# Patient Record
Sex: Male | Born: 1965 | Race: White | Hispanic: No | Marital: Married | Smoking: Never smoker
Health system: Southern US, Community
[De-identification: ages and names within clinical notes are randomized; demographics above are authoritative.]

## PROBLEM LIST (undated history)

## (undated) DIAGNOSIS — E785 Hyperlipidemia, unspecified: Secondary | ICD-10-CM

## (undated) HISTORY — PX: CARPAL TUNNEL RELEASE: SHX101

## (undated) HISTORY — PX: VASECTOMY: SHX75

---

## 2014-10-09 ENCOUNTER — Encounter (HOSPITAL_COMMUNITY)
Admission: EM | Disposition: A | Discharge status: Home / Self Care | Payer: Self-pay | Source: Home / Self Care | Attending: Emergency Medicine

## 2014-10-09 ENCOUNTER — Observation Stay (HOSPITAL_COMMUNITY)
Admission: EM | Admit: 2014-10-09 | Discharge: 2014-10-10 | Disposition: A | Discharge status: Home / Self Care | Payer: No Typology Code available for payment source | Attending: General Surgery | Admitting: General Surgery

## 2014-10-09 ENCOUNTER — Encounter (HOSPITAL_COMMUNITY): Payer: Self-pay | Admitting: Emergency Medicine

## 2014-10-09 ENCOUNTER — Observation Stay (HOSPITAL_COMMUNITY): Payer: No Typology Code available for payment source | Admitting: Registered Nurse

## 2014-10-09 ENCOUNTER — Emergency Department (HOSPITAL_COMMUNITY): Payer: No Typology Code available for payment source

## 2014-10-09 DIAGNOSIS — K3589 Other acute appendicitis without perforation or gangrene: Secondary | ICD-10-CM

## 2014-10-09 DIAGNOSIS — K3580 Unspecified acute appendicitis: Secondary | ICD-10-CM | POA: Diagnosis present

## 2014-10-09 DIAGNOSIS — E785 Hyperlipidemia, unspecified: Secondary | ICD-10-CM | POA: Diagnosis not present

## 2014-10-09 DIAGNOSIS — R109 Unspecified abdominal pain: Secondary | ICD-10-CM

## 2014-10-09 HISTORY — DX: Hyperlipidemia, unspecified: E78.5

## 2014-10-09 HISTORY — PX: LAPAROSCOPIC APPENDECTOMY: SHX408

## 2014-10-09 LAB — URINALYSIS, ROUTINE W REFLEX MICROSCOPIC
Bilirubin Urine: NEGATIVE
GLUCOSE, UA: NEGATIVE mg/dL
Hgb urine dipstick: NEGATIVE
KETONES UR: NEGATIVE mg/dL
Leukocytes, UA: NEGATIVE
Nitrite: NEGATIVE
PH: 7 (ref 5.0–8.0)
Protein, ur: NEGATIVE mg/dL
SPECIFIC GRAVITY, URINE: 1.014 (ref 1.005–1.030)
Urobilinogen, UA: 0.2 mg/dL (ref 0.0–1.0)

## 2014-10-09 LAB — CBC WITH DIFFERENTIAL/PLATELET
Basophils Absolute: 0 10*3/uL (ref 0.0–0.1)
Basophils Relative: 0 % (ref 0–1)
EOS ABS: 0.3 10*3/uL (ref 0.0–0.7)
Eosinophils Relative: 2 % (ref 0–5)
HCT: 46.2 % (ref 39.0–52.0)
Hemoglobin: 16.6 g/dL (ref 13.0–17.0)
LYMPHS ABS: 2.3 10*3/uL (ref 0.7–4.0)
Lymphocytes Relative: 20 % (ref 12–46)
MCH: 30 pg (ref 26.0–34.0)
MCHC: 35.9 g/dL (ref 30.0–36.0)
MCV: 83.5 fL (ref 78.0–100.0)
MONOS PCT: 10 % (ref 3–12)
Monocytes Absolute: 1.1 10*3/uL — ABNORMAL HIGH (ref 0.1–1.0)
NEUTROS ABS: 7.9 10*3/uL — AB (ref 1.7–7.7)
NEUTROS PCT: 68 % (ref 43–77)
Platelets: 182 10*3/uL (ref 150–400)
RBC: 5.53 MIL/uL (ref 4.22–5.81)
RDW: 12.1 % (ref 11.5–15.5)
WBC: 11.6 10*3/uL — ABNORMAL HIGH (ref 4.0–10.5)

## 2014-10-09 LAB — BASIC METABOLIC PANEL
ANION GAP: 10 (ref 5–15)
BUN: 19 mg/dL (ref 6–20)
CHLORIDE: 103 mmol/L (ref 101–111)
CO2: 24 mmol/L (ref 22–32)
CREATININE: 1.1 mg/dL (ref 0.61–1.24)
Calcium: 9.2 mg/dL (ref 8.9–10.3)
GFR calc Af Amer: >60 mL/min (ref >60)
GFR calc non Af Amer: >60 mL/min (ref >60)
Glucose, Bld: 105 mg/dL — ABNORMAL HIGH (ref 65–99)
POTASSIUM: 4.2 mmol/L (ref 3.5–5.1)
Sodium: 137 mmol/L (ref 135–145)

## 2014-10-09 SURGERY — APPENDECTOMY, LAPAROSCOPIC
Anesthesia: General | Site: Abdomen

## 2014-10-09 MED ORDER — BUPIVACAINE-EPINEPHRINE (PF) 0.25% -1:200000 IJ SOLN
INTRAMUSCULAR | Status: DC | PRN
  Administered 2014-10-09: 20 mL

## 2014-10-09 MED ORDER — FENTANYL CITRATE (PF) 100 MCG/2ML IJ SOLN
INTRAMUSCULAR | Status: DC | PRN
  Administered 2014-10-09 (×5): 50 ug via INTRAVENOUS

## 2014-10-09 MED ORDER — OXYCODONE-ACETAMINOPHEN 5-325 MG PO TABS: 1.0000 | ORAL_TABLET | ORAL | Status: DC | PRN

## 2014-10-09 MED ORDER — MIDAZOLAM HCL 2 MG/2ML IJ SOLN
INTRAMUSCULAR | Status: AC
  Filled 2014-10-09: qty 2

## 2014-10-09 MED ORDER — ACETAMINOPHEN 325 MG PO TABS
650.0000 mg | ORAL_TABLET | ORAL | Status: DC | PRN
  Filled 2014-10-09: qty 2

## 2014-10-09 MED ORDER — KCL IN DEXTROSE-NACL 20-5-0.45 MEQ/L-%-% IV SOLN
INTRAVENOUS | Status: DC
  Administered 2014-10-09: 15:00:00 via INTRAVENOUS
  Filled 2014-10-09 (×2): qty 1000

## 2014-10-09 MED ORDER — ROCURONIUM BROMIDE 100 MG/10ML IV SOLN
INTRAVENOUS | Status: DC | PRN
  Administered 2014-10-09 (×2): 10 mg via INTRAVENOUS
  Administered 2014-10-09: 30 mg via INTRAVENOUS

## 2014-10-09 MED ORDER — SODIUM CHLORIDE 0.9 % IV SOLN
INTRAVENOUS | Status: DC
  Administered 2014-10-09: 08:00:00 via INTRAVENOUS

## 2014-10-09 MED ORDER — DEXAMETHASONE SODIUM PHOSPHATE 10 MG/ML IJ SOLN
INTRAMUSCULAR | Status: DC | PRN
  Administered 2014-10-09: 10 mg via INTRAVENOUS

## 2014-10-09 MED ORDER — ONDANSETRON HCL 4 MG/2ML IJ SOLN
INTRAMUSCULAR | Status: DC | PRN
  Administered 2014-10-09: 4 mg via INTRAVENOUS

## 2014-10-09 MED ORDER — KETOROLAC TROMETHAMINE 15 MG/ML IJ SOLN: 15.0000 mg | Freq: Four times a day (QID) | INTRAMUSCULAR | Status: DC | PRN

## 2014-10-09 MED ORDER — ONDANSETRON HCL 4 MG/2ML IJ SOLN
INTRAMUSCULAR | Status: AC
  Filled 2014-10-09: qty 2

## 2014-10-09 MED ORDER — ROCURONIUM BROMIDE 100 MG/10ML IV SOLN
INTRAVENOUS | Status: AC
  Filled 2014-10-09: qty 1

## 2014-10-09 MED ORDER — OXYCODONE HCL 5 MG PO TABS: 5.0000 mg | ORAL_TABLET | Freq: Once | ORAL | Status: DC | PRN

## 2014-10-09 MED ORDER — PROMETHAZINE HCL 25 MG/ML IJ SOLN: 6.2500 mg | INTRAMUSCULAR | Status: DC | PRN

## 2014-10-09 MED ORDER — DEXAMETHASONE SODIUM PHOSPHATE 10 MG/ML IJ SOLN
INTRAMUSCULAR | Status: AC
  Filled 2014-10-09: qty 1

## 2014-10-09 MED ORDER — DEXTROSE 5 % IV SOLN
2.0000 g | INTRAVENOUS | Status: AC
  Administered 2014-10-09: 2 g via INTRAVENOUS
  Filled 2014-10-09: qty 2

## 2014-10-09 MED ORDER — NEOSTIGMINE METHYLSULFATE 10 MG/10ML IV SOLN
INTRAVENOUS | Status: AC
  Filled 2014-10-09: qty 1

## 2014-10-09 MED ORDER — FENTANYL CITRATE (PF) 250 MCG/5ML IJ SOLN
INTRAMUSCULAR | Status: AC
  Filled 2014-10-09: qty 5

## 2014-10-09 MED ORDER — 0.9 % SODIUM CHLORIDE (POUR BTL) OPTIME
TOPICAL | Status: DC | PRN
  Administered 2014-10-09: 1000 mL

## 2014-10-09 MED ORDER — PROPOFOL 10 MG/ML IV BOLUS
INTRAVENOUS | Status: AC
  Filled 2014-10-09: qty 20

## 2014-10-09 MED ORDER — PROPOFOL 10 MG/ML IV BOLUS
INTRAVENOUS | Status: DC | PRN
  Administered 2014-10-09: 180 mg via INTRAVENOUS

## 2014-10-09 MED ORDER — GLYCOPYRROLATE 0.2 MG/ML IJ SOLN
INTRAMUSCULAR | Status: DC | PRN
  Administered 2014-10-09: .6 mg via INTRAVENOUS

## 2014-10-09 MED ORDER — LACTATED RINGERS IV SOLN
INTRAVENOUS | Status: DC | PRN
  Administered 2014-10-09: 11:00:00 via INTRAVENOUS

## 2014-10-09 MED ORDER — OXYCODONE HCL 5 MG/5ML PO SOLN
5.0000 mg | Freq: Once | ORAL | Status: DC | PRN
  Filled 2014-10-09: qty 5

## 2014-10-09 MED ORDER — HYDROMORPHONE HCL 1 MG/ML IJ SOLN: 0.2500 mg | INTRAMUSCULAR | Status: DC | PRN

## 2014-10-09 MED ORDER — SODIUM CHLORIDE 0.9 % IV SOLN: INTRAVENOUS | Status: DC

## 2014-10-09 MED ORDER — IOHEXOL 300 MG/ML  SOLN
50.0000 mL | Freq: Once | INTRAMUSCULAR | Status: AC | PRN
  Administered 2014-10-09: 50 mL via ORAL

## 2014-10-09 MED ORDER — LACTATED RINGERS IR SOLN
Status: DC | PRN
  Administered 2014-10-09: 1000 mL

## 2014-10-09 MED ORDER — MORPHINE SULFATE 2 MG/ML IJ SOLN: 1.0000 mg | INTRAMUSCULAR | Status: DC | PRN

## 2014-10-09 MED ORDER — SUCCINYLCHOLINE CHLORIDE 20 MG/ML IJ SOLN
INTRAMUSCULAR | Status: DC | PRN
  Administered 2014-10-09: 100 mg via INTRAVENOUS

## 2014-10-09 MED ORDER — LIDOCAINE HCL (CARDIAC) 20 MG/ML IV SOLN
INTRAVENOUS | Status: DC | PRN
  Administered 2014-10-09: 100 mg via INTRAVENOUS

## 2014-10-09 MED ORDER — LIDOCAINE HCL (CARDIAC) 20 MG/ML IV SOLN
INTRAVENOUS | Status: AC
  Filled 2014-10-09: qty 5

## 2014-10-09 MED ORDER — ONDANSETRON HCL 4 MG/2ML IJ SOLN: 4.0000 mg | INTRAMUSCULAR | Status: DC | PRN

## 2014-10-09 MED ORDER — ENOXAPARIN SODIUM 40 MG/0.4ML ~~LOC~~ SOLN
40.0000 mg | SUBCUTANEOUS | Status: DC
  Filled 2014-10-09: qty 0.4

## 2014-10-09 MED ORDER — KETOROLAC TROMETHAMINE 30 MG/ML IJ SOLN: 30.0000 mg | Freq: Once | INTRAMUSCULAR | Status: DC | PRN

## 2014-10-09 MED ORDER — DEXTROSE 5 % IV SOLN
1.0000 g | Freq: Four times a day (QID) | INTRAVENOUS | Status: DC
  Administered 2014-10-09 – 2014-10-10 (×3): 1 g via INTRAVENOUS
  Filled 2014-10-09 (×4): qty 1

## 2014-10-09 MED ORDER — IOHEXOL 300 MG/ML  SOLN
100.0000 mL | Freq: Once | INTRAMUSCULAR | Status: AC | PRN
  Administered 2014-10-09: 100 mL via INTRAVENOUS

## 2014-10-09 MED ORDER — GLYCOPYRROLATE 0.2 MG/ML IJ SOLN
INTRAMUSCULAR | Status: AC
  Filled 2014-10-09: qty 3

## 2014-10-09 MED ORDER — NEOSTIGMINE METHYLSULFATE 10 MG/10ML IV SOLN
INTRAVENOUS | Status: DC | PRN
  Administered 2014-10-09: 4 mg via INTRAVENOUS

## 2014-10-09 MED ORDER — BUPIVACAINE-EPINEPHRINE (PF) 0.25% -1:200000 IJ SOLN
INTRAMUSCULAR | Status: AC
  Filled 2014-10-09: qty 30

## 2014-10-09 SURGICAL SUPPLY — 28 items
APPLIER CLIP 5 13 M/L LIGAMAX5 (MISCELLANEOUS)
APPLIER CLIP ROT 10 11.4 M/L (STAPLE)
CHLORAPREP W/TINT 26ML (MISCELLANEOUS) ×2 IMPLANT
CLIP APPLIE 5 13 M/L LIGAMAX5 (MISCELLANEOUS) IMPLANT
CLIP APPLIE ROT 10 11.4 M/L (STAPLE) IMPLANT
CUTTER FLEX LINEAR 45M (STAPLE) ×2 IMPLANT
DECANTER SPIKE VIAL GLASS SM (MISCELLANEOUS) IMPLANT
DRAPE LAPAROSCOPIC ABDOMINAL (DRAPES) ×2 IMPLANT
ELECT REM PT RETURN 9FT ADLT (ELECTROSURGICAL) ×2
ELECTRODE REM PT RTRN 9FT ADLT (ELECTROSURGICAL) ×1 IMPLANT
GLOVE ECLIPSE 7.5 STRL STRAW (GLOVE) ×2 IMPLANT
GOWN STRL REUS W/TWL XL LVL3 (GOWN DISPOSABLE) ×4 IMPLANT
KIT BASIN OR (CUSTOM PROCEDURE TRAY) ×2 IMPLANT
LIQUID BAND (GAUZE/BANDAGES/DRESSINGS) IMPLANT
PENCIL BUTTON HOLSTER BLD 10FT (ELECTRODE) ×2 IMPLANT
POUCH SPECIMEN RETRIEVAL 10MM (ENDOMECHANICALS) ×4 IMPLANT
RELOAD 45 VASCULAR/THIN (ENDOMECHANICALS) IMPLANT
RELOAD STAPLE TA45 3.5 REG BLU (ENDOMECHANICALS) ×2 IMPLANT
SET IRRIG TUBING LAPAROSCOPIC (IRRIGATION / IRRIGATOR) ×2 IMPLANT
SHEARS HARMONIC ACE PLUS 36CM (ENDOMECHANICALS) ×2 IMPLANT
SLEEVE XCEL OPT CAN 5 100 (ENDOMECHANICALS) ×2 IMPLANT
SUT MNCRL AB 4-0 PS2 18 (SUTURE) ×2 IMPLANT
TOWEL OR 17X26 10 PK STRL BLUE (TOWEL DISPOSABLE) ×2 IMPLANT
TRAY FOLEY CATH 16FRSI W/METER (SET/KITS/TRAYS/PACK) ×2 IMPLANT
TRAY LAPAROSCOPIC (CUSTOM PROCEDURE TRAY) ×2 IMPLANT
TROCAR BLADELESS OPT 5 100 (ENDOMECHANICALS) ×2 IMPLANT
TROCAR XCEL 12X100 BLDLESS (ENDOMECHANICALS) IMPLANT
TROCAR XCEL BLUNT TIP 100MML (ENDOMECHANICALS) ×2 IMPLANT

## 2014-10-09 NOTE — ED Notes (Signed)
Pt back from CT at this time. Pt in NAD upon arrival back to unit.

## 2014-10-09 NOTE — ED Notes (Signed)
Pt denies pain with laying but reports 6 or 7 pain with movement.

## 2014-10-09 NOTE — Anesthesia Postprocedure Evaluation (Signed)
  Anesthesia Post-op Note  Patient: Sean GrossFrank Moreno  Procedure(s) Performed: Procedure(s): APPENDECTOMY LAPAROSCOPIC (N/A)  Patient Location: PACU  Anesthesia Type:General  Level of Consciousness: awake and alert   Airway and Oxygen Therapy: Patient Spontanous Breathing  Post-op Pain: mild  Post-op Assessment: Post-op Vital signs reviewed  Post-op Vital Signs: Reviewed  Last Vitals:  Filed Vitals:   10/09/14 1359  BP: 122/68  Pulse: 61  Temp: 36.9 C  Resp:     Complications: No apparent anesthesia complications

## 2014-10-09 NOTE — Anesthesia Preprocedure Evaluation (Addendum)
Anesthesia Evaluation  Patient identified by MRN, date of birth, ID band Patient awake    Reviewed: Allergy & Precautions, NPO status , Patient's Chart, lab work & pertinent test results  History of Anesthesia Complications (+) AWARENESS UNDER ANESTHESIA  Airway Mallampati: I  TM Distance: >3 FB Neck ROM: Full    Dental  (+) Teeth Intact, Dental Advisory Given   Pulmonary neg pulmonary ROS,  breath sounds clear to auscultation        Cardiovascular negative cardio ROS  Rhythm:Regular Rate:Normal     Neuro/Psych negative neurological ROS     GI/Hepatic Neg liver ROS, Acute appendicitis   Endo/Other  negative endocrine ROS  Renal/GU negative Renal ROS     Musculoskeletal negative musculoskeletal ROS (+)   Abdominal   Peds  Hematology negative hematology ROS (+)   Anesthesia Other Findings   Reproductive/Obstetrics                           Lab Results  Component Value Date   WBC 11.6* 10/09/2014   HGB 16.6 10/09/2014   HCT 46.2 10/09/2014   MCV 83.5 10/09/2014   PLT 182 10/09/2014   Lab Results  Component Value Date   CREATININE 1.10 10/09/2014   BUN 19 10/09/2014   NA 137 10/09/2014   K 4.2 10/09/2014   CL 103 10/09/2014   CO2 24 10/09/2014    Anesthesia Physical Anesthesia Plan  ASA: I and emergent  Anesthesia Plan: General   Post-op Pain Management:    Induction: Intravenous, Rapid sequence and Cricoid pressure planned  Airway Management Planned: Oral ETT  Additional Equipment:   Intra-op Plan:   Post-operative Plan: Extubation in OR  Informed Consent: I have reviewed the patients History and Physical, chart, labs and discussed the procedure including the risks, benefits and alternatives for the proposed anesthesia with the patient or authorized representative who has indicated his/her understanding and acceptance.   Dental advisory given  Plan Discussed  with: CRNA  Anesthesia Plan Comments:       Anesthesia Quick Evaluation

## 2014-10-09 NOTE — Transfer of Care (Signed)
Immediate Anesthesia Transfer of Care Note  Patient: Sean GrossFrank Moreno  Procedure(s) Performed: Procedure(s): APPENDECTOMY LAPAROSCOPIC (N/A)  Patient Location: PACU  Anesthesia Type:General  Level of Consciousness: awake, alert , oriented and patient cooperative  Airway & Oxygen Therapy: Patient Spontanous Breathing and Patient connected to face mask oxygen  Post-op Assessment: Report given to RN, Post -op Vital signs reviewed and stable and Patient moving all extremities  Post vital signs: Reviewed and stable  Last Vitals:  Filed Vitals:   10/09/14 0921  BP: 123/64  Pulse: 61  Temp: 36.6 C  Resp: 18    Complications: No apparent anesthesia complications

## 2014-10-09 NOTE — ED Notes (Signed)
Pt presents to ED C/O RLQ pain that started 4 days ago. Pt stated that pain got worse last night, pt also states he is nauseous, but denies throwing up at this time. Pt reports pain as being sharp and cramping like.

## 2014-10-09 NOTE — ED Notes (Signed)
Pt refuses pain/nausea medication administration at present time. Pt states will alert staff if change.

## 2014-10-09 NOTE — ED Provider Notes (Signed)
CSN: [id number]     Arrival date & time 10/09/14  [id number] History   First MD Initiated Contact with Patient 10/09/14 [phone]     Chief Complaint  Patient presents with  . Abdominal Pain     (Consider location/radiation/quality/duration/timing/severity/associated sxs/prior Treatment) Patient is a 49 y.o. male presenting with abdominal pain. The history is provided by the patient.  Abdominal Pain Pain location:  RLQ Pain quality: stabbing   Pain radiates to:  RUQ Pain severity:  Moderate Onset quality:  Gradual Duration:  5 days Timing:  Constant Progression:  Worsening Chronicity:  New Relieved by:  Nothing Worsened by:  Movement Ineffective treatments:  None tried Associated symptoms: nausea   Associated symptoms: no chills, no fever, no hematuria and no vomiting     Past Medical History  Diagnosis Date  . Hyperlipidemia    Past Surgical History  Procedure Laterality Date  . Carpal tunnel release    . Vasectomy     No family history on file. History  Substance Use Topics  . Smoking status: Never Smoker   . Smokeless tobacco: Not on file  . Alcohol Use: Yes     Comment: social    Review of Systems  Constitutional: Negative for fever and chills.  Gastrointestinal: Positive for nausea and abdominal pain. Negative for vomiting.  Genitourinary: Negative for hematuria.  All other systems reviewed and are negative.     Allergies  Review of patient's allergies indicates not on file.  Home Medications   Prior to Admission medications   Not on File   BP 131/83 mmHg  Pulse 71  Temp(Src) 97.9 F (36.6 C) (Oral)  Resp 18  Ht 5\' 6"  (1.676 m)  Wt 170 lb (77.111 kg)  BMI 27.45 kg/m2  SpO2 100% Physical Exam  Constitutional: He is oriented to person, place, and time. He appears well-developed and well-nourished.  Non-toxic appearance. No distress.  HENT:  Head: Normocephalic and atraumatic.  Eyes: Conjunctivae, EOM and lids are normal. Pupils are equal, round, and  reactive to light.  Neck: Normal range of motion. Neck supple. No tracheal deviation present. No thyroid mass present.  Cardiovascular: Normal rate, regular rhythm and normal heart sounds.  Exam reveals no gallop.   No murmur heard. Pulmonary/Chest: Effort normal and breath sounds normal. No stridor. No respiratory distress. He has no decreased breath sounds. He has no wheezes. He has no rhonchi. He has no rales.  Abdominal: Soft. Normal appearance and bowel sounds are normal. He exhibits no distension. There is tenderness in the right lower quadrant. There is guarding. There is no rigidity, no rebound and no CVA tenderness.    Musculoskeletal: Normal range of motion. He exhibits no edema or tenderness.  Neurological: He is alert and oriented to person, place, and time. He has normal strength. No cranial nerve deficit or sensory deficit. GCS eye subscore is 4. GCS verbal subscore is 5. GCS motor subscore is 6.  Skin: Skin is warm and dry. No abrasion and no rash noted.  Psychiatric: He has a normal mood and affect. His speech is normal and behavior is normal.  Nursing note and vitals reviewed.   ED Course  Procedures (including critical care time) Labs Review Labs Reviewed  URINE CULTURE  CBC WITH DIFFERENTIAL/PLATELET  BASIC METABOLIC PANEL  URINALYSIS, ROUTINE W REFLEX MICROSCOPIC    Imaging Review No results found.   EKG Interpretation None      MDM   Final diagnoses:  Abdominal pain    Patient's  abdominal CT positive for appendicitis. Discussed general surgery he will take the patient to the operating room    Lorre NickAnthony Tineshia Becraft, MD 10/09/14 [phone]

## 2014-10-09 NOTE — Op Note (Signed)
Preoperative Diagnosis: Abdominal pain [R10.9] Other acute appendicitis [K35.89]  Postoprative Diagnosis: Abdominal pain [R10.9] Other acute appendicitis [K35.89]  Procedure: Procedure(s): APPENDECTOMY LAPAROSCOPIC   Surgeon: Glenna FellowsHoxworth, Zebulan Hinshaw T   Assistants: none  Anesthesia:  General endotracheal anesthesia  Indications: he presents with 4 days of gradually worsening initially generalized abdominal discomfort which has become localized in the right lower abdomen and constant. He has lack of appetite. There is localized tenderness in the right lower quadrant with mildly elevated white blood count. CT scan has shown evidence of acute appendicitis without perforation or abscess. We have discussed proceeding with laparoscopic appendectomy and he is in agreement.  Procedure Detail: patient was taken operating room, placed in the supine position on the operating table and general anesthesia induced. He received preoperative broad-spectrum IV antibiotics. PAS room place. Foley catheter was placed. The abdomen was widely sterilely prepped and draped. Patient timeout was performed and correct procedure verified. Trocar sites were infiltrated with local anesthesia. A 1.5 cm incision was made at the umbilicus and dissection carried down the midline fascia which was incised for 1 cm and the peritoneum entered under direct vision. Through mattress suture of 0 Vicryl the Hassan trocar was placed and pneumoperitoneum established. Under direct vision 5 mm trochars were placed in the right upper quadrant and left lower quadrant. The base of the appendix and cecum was visualized but the appendix itself was retrocecal and there was a fair amount of edema inferior and lateral to the cecum. Initially I mobilized the cecum dividing lateral peritoneal attachments. I was able to identify the appendix near its base and retracted superiorly and peritoneal attachments to the appendix were divided working out laterally to  the lateral abdominal wall at which point I identified the tip of the cecum. The appendix was very swollen and indurated but no gangrene or perforation. The tip of the appendix was grasped and with careful blunt dissection and with Harmonic scalpel it was mobilized up out of the retroperitoneum. For a couple of centimeters the appendix was densely adherent to the wall of the cecum but with careful blunt dissection I was able to mobilize it off of the cecum with no injury to the wall of the cecum. Finally the appendix was mobilized down to near its base and the appendiceal artery was dissected away from its base and divided with Harmonic scalpel and the appendix was completely freed down to the tip of the cecum. The base of the appendix and tip of the cecum was relatively uninflamed and appendix was divided across the tip of the cecum with a single firing of the Endo GIA 45 mm blue load stapler. The staple line was intact and without bleeding. There was no compromise to the ileal cecal valve. The cecum was uninjured. The appendix was placed in an Endo Catch bag and brought out through the umbilical incision. The right lower quadrant and abdomen were thoroughly irrigated and hemostasis assured. All CO2 was evacuated and trochars removed. The mattress suture was secured at the umbilicus. Skin incisions were closed with subcuticular Monocryl and Dermabond. Sponge needle and instrument counts were correct.    Findings: Severe acute appendicitis, retrocecal appendix without gangrene or perforation  Estimated Blood Loss:  Minimal         Drains: none  Blood Given: none          Specimens: appendix        Complications:  * No complications entered in OR log *  Disposition: PACU - hemodynamically stable.         Condition: stable

## 2014-10-09 NOTE — Anesthesia Procedure Notes (Signed)
Procedure Name: Intubation Date/Time: 10/09/2014 11:19 AM Performed by: Jarvis NewcomerARMISTEAD, Sean Moreno Pre-anesthesia Checklist: Patient identified, Emergency Drugs available, Suction available, Patient being monitored and Timeout performed Patient Re-evaluated:Patient Re-evaluated prior to inductionOxygen Delivery Method: Circle system utilized Preoxygenation: Pre-oxygenation with 100% oxygen (RSI with cricoid pressure by Dr. Sampson GoonFitzgerald) Intubation Type: IV induction, Rapid sequence and Cricoid Pressure applied Laryngoscope Size: Mac and 4 Grade View: Grade I Tube type: Oral Tube size: 7.5 mm Number of attempts: 1 Airway Equipment and Method: Stylet Placement Confirmation: ETT inserted through vocal cords under direct vision and positive ETCO2 Secured at: 23 cm Tube secured with: Tape Dental Injury: Teeth and Oropharynx as per pre-operative assessment

## 2014-10-09 NOTE — ED Notes (Signed)
Pt to CT at this time, in NAD upon departure.

## 2014-10-09 NOTE — H&P (Signed)
Sean Moreno 08-08-1965  [id number].   Chief Complaint/Reason for Consult: abdominal pain HPI: This is a 49 yo male who was coming back from the beach on Sunday and developed some crampy abdominal pain that was in the central abdomen.  He had some bloating with this as well.  He continued to have a good appetite, but remained bloated.  No nausea until this morning.  Last night, he began getting worsening mid abdominal pain that localized to the RLQ.  He went to the [facility] and was noted to have a WBC of 11.6K and a CT scan that revealed appendicitis.  We will admit him to our service.  ROS : Please see HPI, otherwise negative   History reviewed. No pertinent family history.  Past Medical History  Diagnosis Date  . Hyperlipidemia     Past Surgical History  Procedure Laterality Date  . Carpal tunnel release    . Vasectomy      Social History:  reports that he has never smoked. He does not have any smokeless tobacco history on file. He reports that he drinks alcohol. He reports that he does not use illicit drugs.  Allergies: No Known Allergies   (Not in a hospital admission)  Blood pressure 123/64, pulse 61, temperature 97.9 F (36.6 C), temperature source Oral, resp. rate 18, height _0  (1.676 m), weight 77.111 kg (170 lb), SpO2 100 %. Physical Exam: General: pleasant, WD, WN white male who is laying in bed in NAD HEENT: head is normocephalic, atraumatic.  Sclera are noninjected.  PERRL.  Ears and nose without any masses or lesions.  Mouth is pink and moist Heart: regular, rate, and rhythm.  Normal s1,s2. No obvious murmurs, gallops, or rubs noted.  Palpable radial and pedal pulses bilaterally Lungs: CTAB, no wheezes, rhonchi, or rales noted.  Respiratory effort nonlabored Abd: soft, tender in RLQ, mild in RUQ, ND, +BS, no masses, hernias, or organomegaly MS: all 4 extremities are symmetrical with no cyanosis, clubbing, or edema. Skin: warm and dry with no masses, lesions, or  rashes Psych: A&Ox3 with an appropriate affect.    Results for orders placed or performed during the hospital encounter of 10/09/14 (from the past 48 hour(s))  CBC with Differential/Platelet     Status: Abnormal   Collection Time: 10/09/14  7:39 AM  Result Value Ref Range   WBC 11.6 (H) 4.0 - 10.5 K/uL   RBC 5.53 4.22 - 5.81 MIL/uL   Hemoglobin 16.6 13.0 - 17.0 g/dL   HCT 46.2 39.0 - 52.0 %   MCV 83.5 78.0 - 100.0 fL   MCH 30.0 26.0 - 34.0 pg   MCHC 35.9 30.0 - 36.0 g/dL   RDW 12.1 11.5 - 15.5 %   Platelets 182 150 - 400 K/uL   Neutrophils Relative % 68 43 - 77 %   Neutro Abs 7.9 (H) 1.7 - 7.7 K/uL   Lymphocytes Relative 20 12 - 46 %   Lymphs Abs 2.3 0.7 - 4.0 K/uL   Monocytes Relative 10 3 - 12 %   Monocytes Absolute 1.1 (H) 0.1 - 1.0 K/uL   Eosinophils Relative 2 0 - 5 %   Eosinophils Absolute 0.3 0.0 - 0.7 K/uL   Basophils Relative 0 0 - 1 %   Basophils Absolute 0.0 0.0 - 0.1 K/uL  Basic metabolic panel     Status: Abnormal   Collection Time: 10/09/14  7:39 AM  Result Value Ref Range   Sodium 137 135 - 145 mmol/L  Potassium 4.2 3.5 - 5.1 mmol/L   Chloride 103 101 - 111 mmol/L   CO2 24 22 - 32 mmol/L   Glucose, Bld 105 (H) 65 - 99 mg/dL   BUN 19 6 - 20 mg/dL   Creatinine, Ser 1.10 0.61 - 1.24 mg/dL   Calcium 9.2 8.9 - 10.3 mg/dL   GFR calc non Af Amer >60 >60 mL/min   GFR calc Af Amer >60 >60 mL/min    Comment: (NOTE) The eGFR has been calculated using the CKD EPI equation. This calculation has not been validated in all clinical situations. eGFR's persistently <60 mL/min signify possible Chronic Kidney Disease.    Anion gap 10 5 - 15  Urinalysis, Routine w reflex microscopic     Status: None   Collection Time: 10/09/14  8:04 AM  Result Value Ref Range   Color, Urine YELLOW YELLOW   APPearance CLEAR CLEAR   Specific Gravity, Urine 1.014 1.005 - 1.030   pH 7.0 5.0 - 8.0   Glucose, UA NEGATIVE NEGATIVE mg/dL   Hgb urine dipstick NEGATIVE NEGATIVE   Bilirubin  Urine NEGATIVE NEGATIVE   Ketones, ur NEGATIVE NEGATIVE mg/dL   Protein, ur NEGATIVE NEGATIVE mg/dL   Urobilinogen, UA 0.2 0.0 - 1.0 mg/dL   Nitrite NEGATIVE NEGATIVE   Leukocytes, UA NEGATIVE NEGATIVE    Comment: MICROSCOPIC NOT DONE ON URINES WITH NEGATIVE PROTEIN, BLOOD, LEUKOCYTES, NITRITE, OR GLUCOSE <1000 mg/dL.   Ct Abdomen Pelvis W Contrast  10/09/2014   CLINICAL DATA:  49 year old male with a history of right lower quadrant abdominal pain for 4 days.  EXAM: CT ABDOMEN AND PELVIS WITH CONTRAST  TECHNIQUE: Multidetector CT imaging of the abdomen and pelvis was performed using the standard protocol following bolus administration of intravenous contrast.  CONTRAST:  166m OMNIPAQUE IOHEXOL 300 MG/ML  SOLN  COMPARISON:  None.  FINDINGS: Low chest:  Unremarkable appearance of the superficial soft tissues of the chest wall.  Heart size within normal limits.  No pericardial fluid/ thickening.  No hiatal hernia.  No mediastinal adenopathy.  No confluent airspace disease, pneumothorax, or pleural effusion.  Abdomen/ pelvis:  Unremarkable appearance of the liver.  Unremarkable spleen.  Unremarkable bilateral adrenal glands.  Unremarkable pancreas.  Unremarkable gallbladder.  Right kidney unremarkable without hydronephrosis or nephrolithiasis. No perinephric stranding.  Left kidney unremarkable without hydronephrosis or nephrolithiasis. Unremarkable course of the bilateral ureters.  Unremarkable appearance of the urinary bladder.  Inflamed dilated appendix, with hyper enhancing wall. Diameter measures 12 mm. Inflammatory changes of the adjacent mesenteric. No extraluminal air or fluid. No abscess formation.  Colonic diverticula are present. No associated inflammatory changes.  Enteric contrast reaches the transverse colon with no evidence of obstruction.  No intraperitoneal or retroperitoneal lymphadenopathy. There are a few lymph nodes along the ileocolic nodal station, likely reactive though not enlarged by  CT size criteria.  Unremarkable appearance of the vasculature with no significant calcifications. No aneurysm or dissection flap.  No displaced fracture. No significant degenerative changes of the spine.  IMPRESSION: Acute appendicitis without complicating features.  These preliminary results were discussed by telephone at the time of interpretation on 10/09/2014 at 9:19 am to Dr. ALacretia Leigh, who verbally acknowledged these results.  Signed,  JDulcy Fanny WEarleen Newport DO  [facility]  [facility]   Electronically Signed   By: JCorrie MckusickD.O.   On: 10/09/2014 09:32       Assessment/Plan 1. Acute appendicitis -admit -cefoxitin to be give -the procedure,  including risks, complications, and expected outcome have been discussed with the patient.  He understands and is agreeable to proceed.  Geriann Lafont E 10/09/2014, 9:50 AM Pager: [phone]

## 2014-10-09 NOTE — ED Notes (Signed)
Bed: WA09 Expected date:  Expected time:  Means of arrival:  Comments: Patient in room

## 2014-10-10 ENCOUNTER — Encounter (HOSPITAL_COMMUNITY): Payer: Self-pay | Admitting: General Surgery

## 2014-10-10 LAB — CBC
HCT: 42.3 % (ref 39.0–52.0)
Hemoglobin: 14.7 g/dL (ref 13.0–17.0)
MCH: 29 pg (ref 26.0–34.0)
MCHC: 34.8 g/dL (ref 30.0–36.0)
MCV: 83.4 fL (ref 78.0–100.0)
Platelets: 206 10*3/uL (ref 150–400)
RBC: 5.07 MIL/uL (ref 4.22–5.81)
RDW: 12.2 % (ref 11.5–15.5)
WBC: 13.5 10*3/uL — AB (ref 4.0–10.5)

## 2014-10-10 LAB — URINE CULTURE
COLONY COUNT: NO GROWTH
Culture: NO GROWTH

## 2014-10-10 MED ORDER — TRAMADOL HCL 50 MG PO TABS: 50.0000 mg | ORAL_TABLET | Freq: Four times a day (QID) | ORAL | Status: AC | PRN

## 2014-10-10 NOTE — Progress Notes (Signed)
Discharged to home with instructions.  Sharrell Ku Dvora Buitron RN

## 2014-10-10 NOTE — Discharge Summary (Signed)
Patient ID: Sean Moreno MRN: [id number] DOB/AGE: 12-09-1965 49 y.o.  Admit date: 10/09/2014 Discharge date: 10/10/2014  Procedures: lap appy by Dr. Johna SheriffHoxworth 10-09-14  Consults: None  Reason for Admission: This is a 49 yo male who was coming back from the beach on Sunday and developed some crampy abdominal pain that was in the central abdomen. He had some bloating with this as well. He continued to have a good appetite, but remained bloated. No nausea until this morning. Last night, he began getting worsening mid abdominal pain that localized to the RLQ. [facility] was noted to have a WBC of 11.6K and a CT scan that revealed appendicitis. We will admit him to our service.  Admission Diagnoses:  1. Acute appendicitis  Hospital Course: The patient was admitted and taken to the OR where he underwent the above procedure.  He was kept on 24 hours of abx post op.  He was tolerating a solid diet and his pain was controlled on POD 1.  He was stable for dc home.  PE: Abd: soft, appropriately tender, +BS, ND, incisions c/d/i  Discharge Diagnoses:  Active Problems:   Acute appendicitis s/p lap appy  Discharge Medications:   Medication List    TAKE these medications        ibuprofen 200 MG tablet  Commonly known as:  ADVIL,MOTRIN  Take 400 mg by mouth every 6 (six) hours as needed for headache, mild pain or moderate pain.     traMADol 50 MG tablet  Commonly known as:  ULTRAM  Take 1 tablet (50 mg total) by mouth every 6 (six) hours as needed.     VYTORIN 10-20 MG per tablet  Generic drug:  ezetimibe-simvastatin  Take 1 tablet by mouth daily.        Discharge Instructions:     Follow-up Information    Follow up with Mariella SaaHOXWORTH,BENJAMIN T, MD.   Specialty:  General Surgery   Contact information:   [address] [place] [id number] [phone]       Signed: Letha CapeOSBORNE,Regene Mccarthy E 10/10/2014, 7:54 AM

## 2014-10-10 NOTE — Discharge Instructions (Signed)
[facility] ______[facility] °LAPAROSCOPIC SURGERY: POST OP INSTRUCTIONS °Always review your discharge instruction sheet given to you by the facility where your surgery was performed. °IF YOU HAVE DISABILITY OR FAMILY LEAVE FORMS, YOU MUST BRING THEM TO THE OFFICE FOR PROCESSING.   °DO NOT GIVE THEM TO YOUR DOCTOR. ° °1. A prescription for pain medication may be given to you upon discharge.  Take your pain medication as prescribed, if needed.  If narcotic pain medicine is not needed, then you may take acetaminophen (Tylenol) or ibuprofen (Advil) as needed. °2. Take your usually prescribed medications unless otherwise directed. °3. If you need a refill on your pain medication, please contact your pharmacy.  They will contact our office to request authorization. Prescriptions will not be filled after 5pm or on week-ends. °4. You should follow a light diet the first few days after arrival home, such as soup and crackers, etc.  Be sure to include lots of fluids daily. °5. Most patients will experience some swelling and bruising in the area of the incisions.  Ice packs will help.  Swelling and bruising can take several days to resolve.  °6. It is common to experience some constipation if taking pain medication after surgery.  Increasing fluid intake and taking a stool softener (such as Colace) will usually help or prevent this problem from occurring.  A mild laxative (Milk of Magnesia or Miralax) should be taken according to package instructions if there are no bowel movements after 48 hours. °7. Unless discharge instructions indicate otherwise, you may remove your bandages 24-48 hours after surgery, and you may shower at that time.  You may have steri-strips (small skin tapes) in place directly over the incision.  These strips should be left on the skin for 7-10 days.  If your surgeon used skin glue on the incision, you may shower in 24 hours.  The glue will flake off over the next 2-3 weeks.  Any sutures or  staples will be removed at the office during your follow-up visit. °8. ACTIVITIES:  You may resume regular (light) daily activities beginning the next day--such as daily self-care, walking, climbing stairs--gradually increasing activities as tolerated.  You may have sexual intercourse when it is comfortable.  Refrain from any heavy lifting or straining until approved by your doctor. °a. You may drive when you are no longer taking prescription pain medication, you can comfortably wear a seatbelt, and you can safely maneuver your car and apply brakes. °b. RETURN TO WORK:  __________________________________________________________ °9. You should see your doctor in the office for a follow-up appointment approximately 2-3 weeks after your surgery.  Make sure that you call for this appointment within a day or two after you arrive home to insure a convenient appointment time. °10. OTHER INSTRUCTIONS: __________________________________________________________________________________________________________________________ __________________________________________________________________________________________________________________________ °WHEN TO CALL YOUR DOCTOR: °1. Fever over 101.0 °2. Inability to urinate °3. Continued bleeding from incision. °4. Increased pain, redness, or drainage from the incision. °5. Increasing abdominal pain ° °The clinic staff is available to answer your questions during regular business hours.  Please don’t hesitate to call and ask to speak to one of the nurses for clinical concerns.  If you have a medical emergency, go to the nearest emergency room or call 911.  A surgeon from [facility] is always on call at the hospital. °[address], [address], [place], [place]  [address] ? [address] [address], [place], [place]   [address] °[phone] ? [phone] ? FAX [phone] °Web site:   [email or web address] °

## 2016-02-23 ENCOUNTER — Encounter: Payer: Self-pay | Admitting: *Deleted

## 2016-02-23 NOTE — Progress Notes (Signed)
[facility] [phone]

## 2017-06-02 DIAGNOSIS — R7301 Impaired fasting glucose: Secondary | ICD-10-CM | POA: Diagnosis not present

## 2017-06-02 DIAGNOSIS — E7849 Other hyperlipidemia: Secondary | ICD-10-CM | POA: Diagnosis not present

## 2017-06-02 DIAGNOSIS — Z6829 Body mass index (BMI) 29.0-29.9, adult: Secondary | ICD-10-CM | POA: Diagnosis not present

## 2017-06-02 DIAGNOSIS — Z0000 Encounter for general adult medical examination without abnormal findings: Secondary | ICD-10-CM | POA: Diagnosis not present

## 2017-06-02 DIAGNOSIS — Z1389 Encounter for screening for other disorder: Secondary | ICD-10-CM | POA: Diagnosis not present

## 2018-06-01 DIAGNOSIS — Z0000 Encounter for general adult medical examination without abnormal findings: Secondary | ICD-10-CM | POA: Diagnosis not present

## 2018-06-01 DIAGNOSIS — R82998 Other abnormal findings in urine: Secondary | ICD-10-CM | POA: Diagnosis not present

## 2018-06-01 DIAGNOSIS — Z125 Encounter for screening for malignant neoplasm of prostate: Secondary | ICD-10-CM | POA: Diagnosis not present

## 2018-06-01 DIAGNOSIS — R7301 Impaired fasting glucose: Secondary | ICD-10-CM | POA: Diagnosis not present

## 2018-06-08 DIAGNOSIS — E7849 Other hyperlipidemia: Secondary | ICD-10-CM | POA: Diagnosis not present

## 2018-06-08 DIAGNOSIS — Z0000 Encounter for general adult medical examination without abnormal findings: Secondary | ICD-10-CM | POA: Diagnosis not present

## 2018-06-08 DIAGNOSIS — Z1389 Encounter for screening for other disorder: Secondary | ICD-10-CM | POA: Diagnosis not present

## 2018-06-08 DIAGNOSIS — R5383 Other fatigue: Secondary | ICD-10-CM | POA: Diagnosis not present

## 2018-06-08 DIAGNOSIS — R7301 Impaired fasting glucose: Secondary | ICD-10-CM | POA: Diagnosis not present

## 2019-10-26 ENCOUNTER — Ambulatory Visit: Payer: No Typology Code available for payment source

## 2020-09-22 ENCOUNTER — Other Ambulatory Visit (HOSPITAL_COMMUNITY): Payer: Self-pay | Admitting: *Deleted

## 2020-09-24 ENCOUNTER — Ambulatory Visit (HOSPITAL_COMMUNITY)
Admission: RE | Admit: 2020-09-24 | Discharge: 2020-09-24 | Disposition: A | Discharge status: Home / Self Care | Payer: PRIVATE HEALTH INSURANCE | Source: Ambulatory Visit | Attending: Cardiology | Admitting: Cardiology

## 2022-09-13 IMAGING — CT CT CARDIAC CORONARY ARTERY CALCIUM SCORE
2 series · 14 of 20 positions shown, 16 images · non-contrast
Comparison: CT abdomen and pelvis 10/09/2014

Addendum:
CLINICAL DATA: cardiovascular disease risk stratification

EXAM:
Coronary Calcium Score
TECHNIQUE: A gated, non-contrast computed tomography scan of the heart was
performed using 3mm slice thickness. Axial images were analyzed on a
dedicated workstation. Calcium scoring of the coronary arteries was
performed using the Agatston method.

[Series 3: 2 hrt calcium · axial · 0.39mm/px · z∈[-191,-74]mm · 8 of 51 slices shown, 10 images]
[im 6/51  vessel]
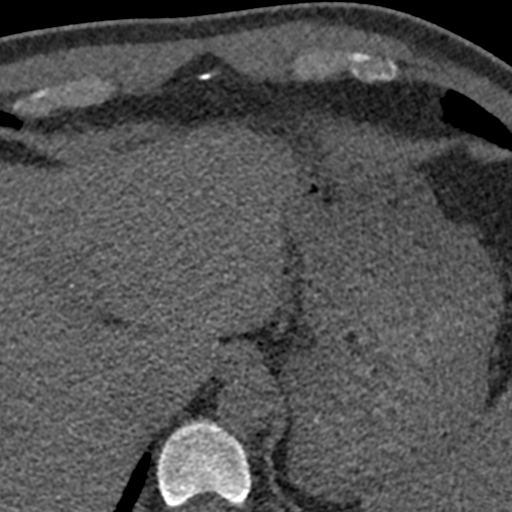
[im 6/51  lung]
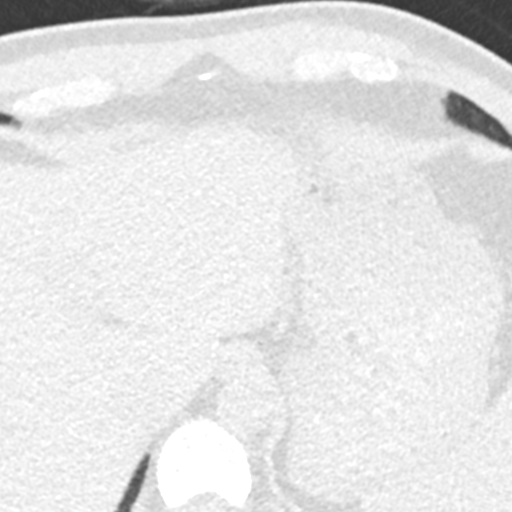
[im 12/51  vessel]
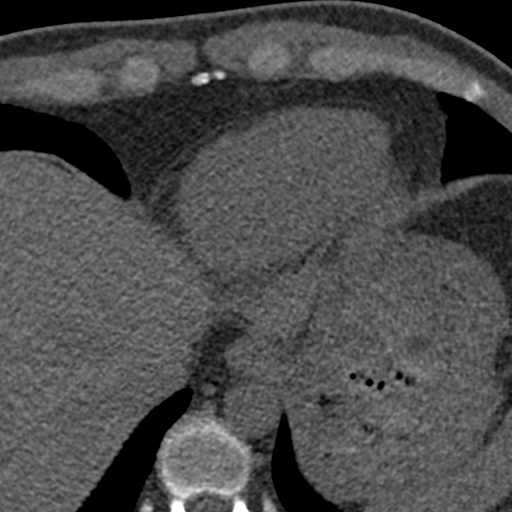
[im 17/51  vessel]
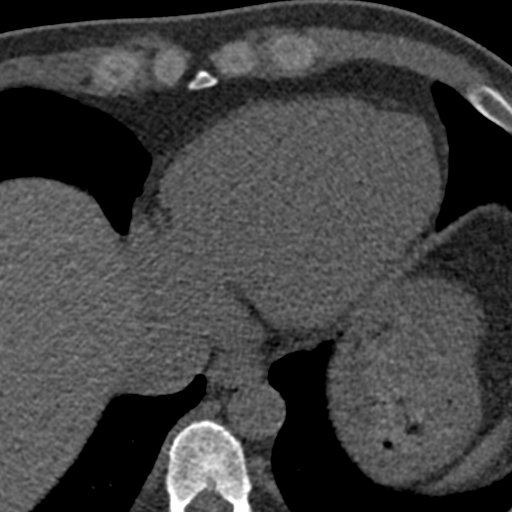
[im 23/51  vessel]
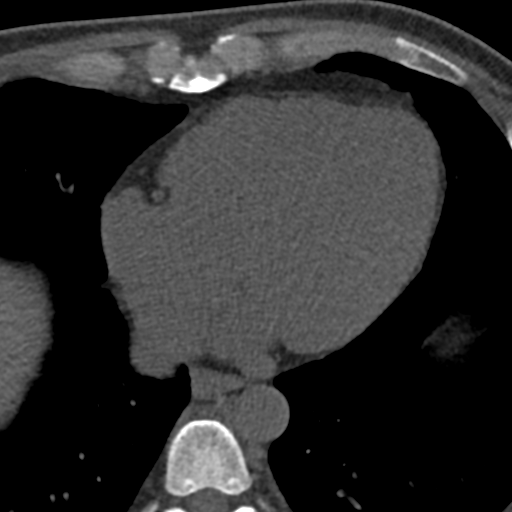
[im 28/51  vessel]
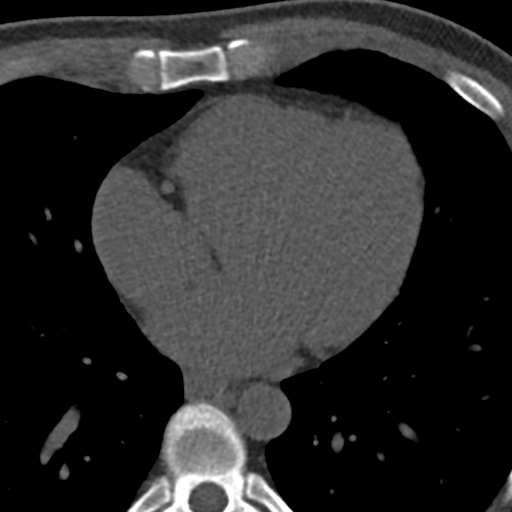
[im 28/51  lung]
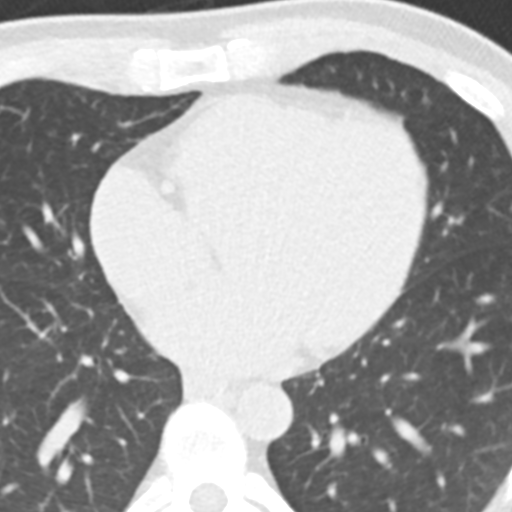
[im 34/51  vessel]
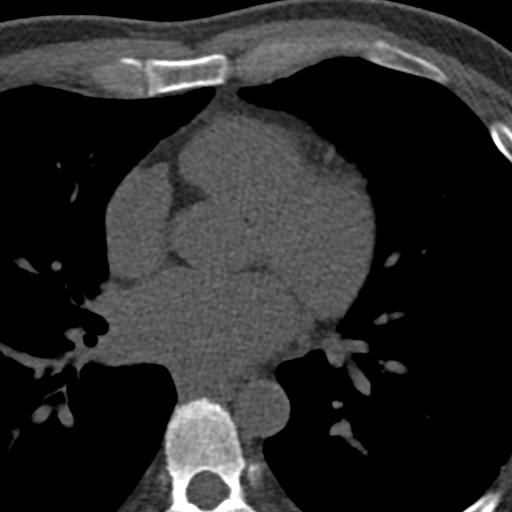
[im 39/51  vessel]
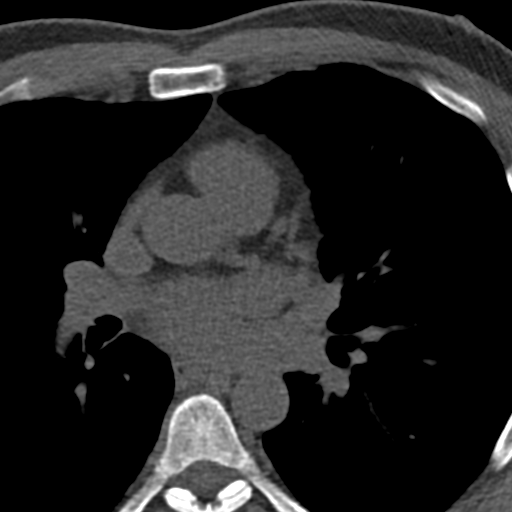
[im 45/51  vessel]
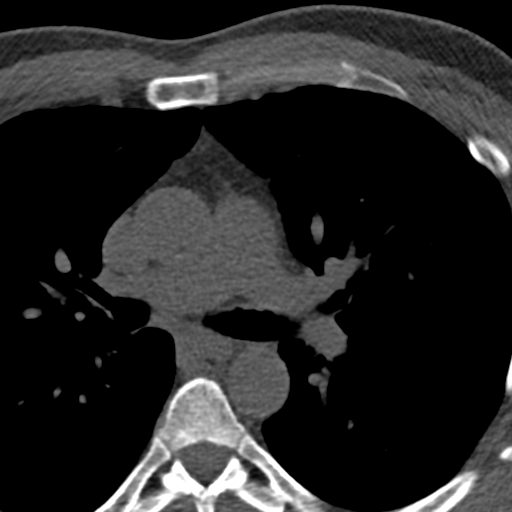

[Series 4: 2 soft full fov 68 % · axial · 0.74mm/px · z∈[-194,-107]mm · 6 of 52 slices shown]
[im 6/52  vessel]
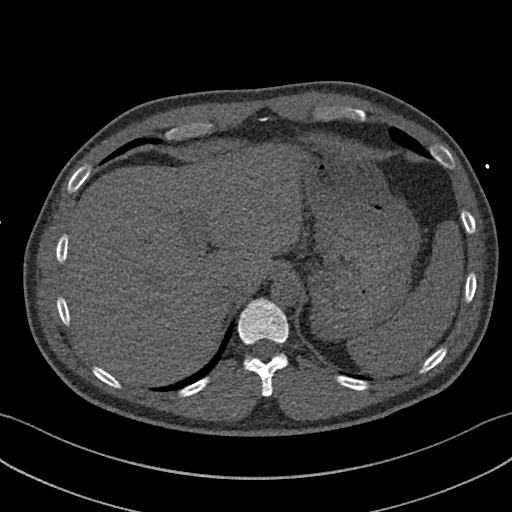
[im 12/52  vessel]
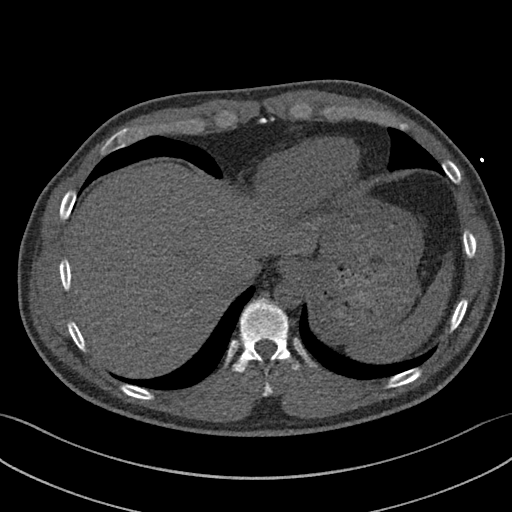
[im 18/52  vessel]
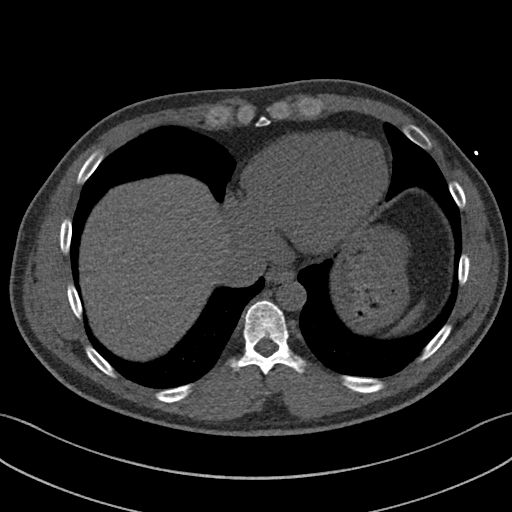
[im 23/52  vessel]
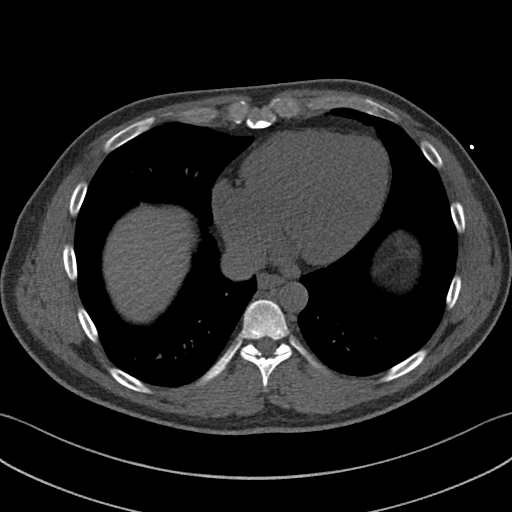
[im 29/52  vessel]
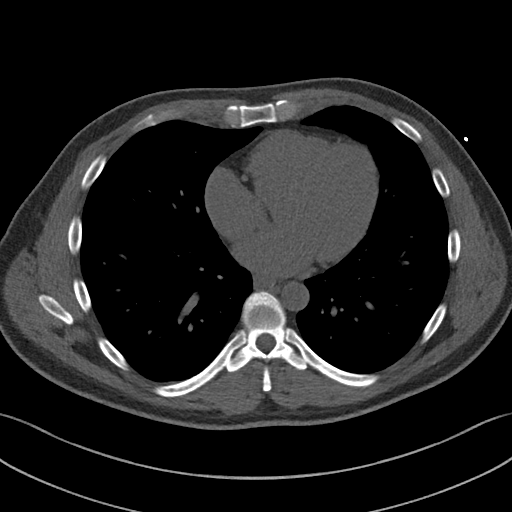
[im 35/52  vessel]
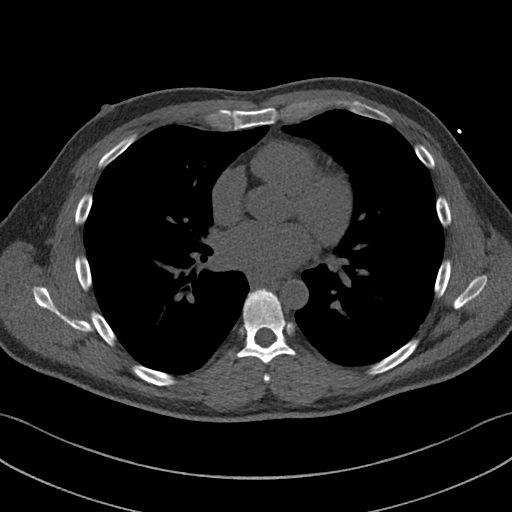

[14 of 20 positions shown; findings below may reference images not displayed]

FINDINGS: Coronary arteries: Normal origins.

Coronary Calcium Score: 0

Pericardium: Normal.

Ascending Aorta: Normal caliber.

Non-cardiac: See separate report from [REDACTED].
IMPRESSION: Coronary calcium score of 0. This was 0 percentile for age-, race-,
and sex-matched controls.

Interpretation of the non-coronary thoracic structures by radiology
is pending.



If CAC=0, it is reasonable to withhold statin therapy and reassess
in 5 to 10 years, as long as higher risk conditions are absent
(diabetes mellitus, family history of premature CHD in first degree
relatives (males <55 years; females <65 years), cigarette smoking,
or LDL >=190 mg/dL).

If CAC is 1 to 99, it is reasonable to initiate statin therapy for
patients >=55 years of age.

If CAC is >=100 or >=75th percentile, it is reasonable to initiate
statin therapy at any age.

Cardiology referral should be considered for patients with CAC
scores >=400 or >=75th percentile.

*1862 AHA/ACC/AACVPR/AAPA/ABC/POLYNICE/MANGAT/BASEVIC/Abouzra/PESO/JOSEY/BODNAR
Guideline on the Management of Blood Cholesterol: A Report of the
American College of Cardiology/American Heart Association Task Force
on Clinical Practice Guidelines. J Am Coll Cardiol.
3674;73(24):6376-6398.

EXAM:
OVER-READ INTERPRETATION  CT CHEST

The following report is an over-read performed by radiologist Dr.
does not include interpretation of cardiac or coronary anatomy or
pathology. The coronary calcium score interpretation by the
cardiologist is attached.
FINDINGS: Normal caliber of the visualized thoracic aorta without
atherosclerotic calcifications. Visualized mediastinal and upper
abdominal structures are unremarkable. No large pleural effusions.
Visualized lungs are clear. No acute bone abnormality.
IMPRESSION: Negative over-read exam.

*** End of Addendum ***
FINDINGS: Coronary arteries: Normal origins.

Coronary Calcium Score: 0

Pericardium: Normal.

Ascending Aorta: Normal caliber.

Non-cardiac: See separate report from [REDACTED].
IMPRESSION: Coronary calcium score of 0. This was 0 percentile for age-, race-,
and sex-matched controls.

Interpretation of the non-coronary thoracic structures by radiology
is pending.



If CAC=0, it is reasonable to withhold statin therapy and reassess
in 5 to 10 years, as long as higher risk conditions are absent
(diabetes mellitus, family history of premature CHD in first degree
relatives (males <55 years; females <65 years), cigarette smoking,
or LDL >=190 mg/dL).

If CAC is 1 to 99, it is reasonable to initiate statin therapy for
patients >=55 years of age.

If CAC is >=100 or >=75th percentile, it is reasonable to initiate
statin therapy at any age.

Cardiology referral should be considered for patients with CAC
scores >=400 or >=75th percentile.

*1862 AHA/ACC/AACVPR/AAPA/ABC/POLYNICE/MANGAT/BASEVIC/Abouzra/PESO/JOSEY/BODNAR
Guideline on the Management of Blood Cholesterol: A Report of the
American College of Cardiology/American Heart Association Task Force
on Clinical Practice Guidelines. J Am Coll Cardiol.
3674;73(24):6376-6398.
